# Patient Record
Sex: Female | Born: 1965 | Race: Black or African American | Hispanic: No | Marital: Single | Smoking: Former smoker
Health system: Southern US, Community
[De-identification: ages and names within clinical notes are randomized; demographics above are authoritative.]

## PROBLEM LIST (undated history)

## (undated) DIAGNOSIS — F419 Anxiety disorder, unspecified: Secondary | ICD-10-CM

## (undated) DIAGNOSIS — N63 Unspecified lump in unspecified breast: Secondary | ICD-10-CM

## (undated) DIAGNOSIS — I10 Essential (primary) hypertension: Secondary | ICD-10-CM

## (undated) DIAGNOSIS — F32A Depression, unspecified: Secondary | ICD-10-CM

## (undated) DIAGNOSIS — F329 Major depressive disorder, single episode, unspecified: Secondary | ICD-10-CM

---

## 2005-01-21 ENCOUNTER — Ambulatory Visit: Payer: Self-pay | Admitting: Family Medicine

## 2005-08-11 ENCOUNTER — Encounter: Admission: RE | Admit: 2005-08-11 | Discharge: 2005-08-11 | Payer: Self-pay | Admitting: Obstetrics and Gynecology

## 2005-12-16 ENCOUNTER — Other Ambulatory Visit: Admission: RE | Admit: 2005-12-16 | Discharge: 2005-12-16 | Payer: Self-pay | Admitting: Obstetrics and Gynecology

## 2007-02-19 ENCOUNTER — Encounter: Admission: RE | Admit: 2007-02-19 | Discharge: 2007-02-19 | Payer: Self-pay | Admitting: Obstetrics and Gynecology

## 2007-12-27 ENCOUNTER — Ambulatory Visit (HOSPITAL_COMMUNITY): Admission: RE | Admit: 2007-12-27 | Discharge: 2007-12-27 | Payer: Self-pay | Admitting: Obstetrics and Gynecology

## 2007-12-27 ENCOUNTER — Encounter (INDEPENDENT_AMBULATORY_CARE_PROVIDER_SITE_OTHER): Payer: Self-pay | Admitting: Obstetrics and Gynecology

## 2007-12-28 ENCOUNTER — Inpatient Hospital Stay (HOSPITAL_COMMUNITY): Admission: AD | Admit: 2007-12-28 | Discharge: 2007-12-28 | Payer: Self-pay | Admitting: Obstetrics and Gynecology

## 2007-12-31 ENCOUNTER — Inpatient Hospital Stay (HOSPITAL_COMMUNITY): Admission: AD | Admit: 2007-12-31 | Discharge: 2007-12-31 | Payer: Self-pay | Admitting: Obstetrics and Gynecology

## 2008-01-03 ENCOUNTER — Inpatient Hospital Stay (HOSPITAL_COMMUNITY): Admission: AD | Admit: 2008-01-03 | Discharge: 2008-01-03 | Payer: Self-pay | Admitting: Family Medicine

## 2008-01-05 ENCOUNTER — Inpatient Hospital Stay (HOSPITAL_COMMUNITY): Admission: AD | Admit: 2008-01-05 | Discharge: 2008-01-05 | Payer: Self-pay | Admitting: Obstetrics and Gynecology

## 2008-01-10 ENCOUNTER — Inpatient Hospital Stay (HOSPITAL_COMMUNITY): Admission: AD | Admit: 2008-01-10 | Discharge: 2008-01-10 | Payer: Self-pay | Admitting: Obstetrics and Gynecology

## 2008-01-17 ENCOUNTER — Inpatient Hospital Stay (HOSPITAL_COMMUNITY): Admission: AD | Admit: 2008-01-17 | Discharge: 2008-01-17 | Payer: Self-pay | Admitting: Obstetrics and Gynecology

## 2010-03-03 ENCOUNTER — Encounter: Payer: Self-pay | Admitting: Obstetrics and Gynecology

## 2010-03-04 ENCOUNTER — Encounter: Payer: Self-pay | Admitting: Internal Medicine

## 2010-06-25 NOTE — Op Note (Signed)
Samantha Spence, Samantha Spence             ACCOUNT NO.:  [id number]   MEDICAL RECORD NO.:  [id number]          PATIENT TYPE:  AMB   LOCATION:  [facility]                          FACILITY:  [facility]   PHYSICIAN:  Hal Morales, M.D.DATE OF BIRTH:  Nov 14, 1965   DATE OF PROCEDURE:  12/27/2007  DATE OF DISCHARGE:                               OPERATIVE REPORT   PREOPERATIVE DIAGNOSIS:  Abnormal uterine bleeding with pregnancy.   POSTOPERATIVE DIAGNOSIS:  Abnormal uterine bleeding with pregnancy.   OPERATION:  Dilatation and evacuation.   SURGEON:  Hal Morales, MD.   ANESTHESIA:  General LMA.   ESTIMATED BLOOD LOSS:  Less than 10 mL.   COMPLICATIONS:  None.   FINDINGS:  The uterine cavity deviated slightly to the right.  Minimal  uterine contents were obtained at dilatation and evacuation.   PROCEDURE:  The patient was taken to the operating room after  appropriate identification and after discussion of the indications for  procedures as well as the risks involved, which include but are not  limited to anesthesia, bleeding, infection, or damage to adjacent  organs.  The discussion was also held that this was a means by which  further delineation of the location of her pregnancy could be discerned.  It was explained that if no products of conception are obtained, a  followup HCG will be done and if it has not dropped significantly, then  the patient will be recommended to undergo a methotrexate therapy.  The  patient was thus taken to the operating room and placed on the operating  table.  After placement of equipment for anesthesia and induction of  anesthesia, the patient was placed in the lithotomy position.  The  perineum and vagina were prepped with multiple layers of Betadine and  draped as a sterile field.  A latex-free catheter was then used to empty  the bladder.  A Graves speculum was placed in the vagina and a  paracervical block was achieved with a total of 10 mL of 2%  Xylocaine in  the 5 and 7 o'clock positions.  The cervix was grasped with a tenaculum.  The cervix was then dilated to accommodate a #7 suction curette and this  was used to suction and evacuate all quadrants of the uterus, but  minimal contents were obtained.  Sharp curettage was then undertaken  with minimal contents.  All instruments were then removed from the  vagina after a hemostatic suture was placed on the anterior cervix at  the site of the tenaculum with a suture of 2-0 chromic.  The patient was  awakened from anesthesia and taken to the recovery room in satisfactory  condition, having tolerated the procedure well with sponge and  instrument counts correct.   SPECIMENS TO PATHOLOGY:  Uterine contents.   DISCHARGE INSTRUCTIONS:  Printed instructions from the [facility]  for D&E.   DISCHARGE MEDICATIONS:  1. Vicodin 1-2 p.o. q.4 h. p.r.n. pain.  2. Ibuprofen 600 mg p.o. q.6 h. for the next 3 days, then q.6 h.      p.r.n. pain  3. Doxycycline 100 mg p.o. b.i.d.  for the next 7 days.   FOLLOWUP:  The patient will follow up in 1 week for a followup HCG and  then we will have a 2-week postoperative appointment.      Hal Morales, M.D.  Electronically Signed     VPH/MEDQ  D:  12/27/2007  T:  12/28/2007  Job:  [id number]

## 2010-06-25 NOTE — Consult Note (Signed)
Samantha Spence, Samantha Spence             ACCOUNT NO.:  [id number]   MEDICAL RECORD NO.:  [id number]          PATIENT TYPE:  MAT   LOCATION:  [facility]                          FACILITY:  [facility]   PHYSICIAN:  Janine Limbo, M.D.DATE OF BIRTH:  [birth year]   DATE OF CONSULTATION:  DATE OF DISCHARGE:  12/28/2007                                 CONSULTATION   HISTORY OF PRESENT ILLNESS:  Samantha Spence is a 45 year old female, gravida  4, para 2-0-1-2, who presents with a very early pregnancy.  Her HCG  values had been increasing, but not appropriately.  The patient had  dilatation and evacuation on December 27, 2007, which showed scant  tissue.  Pathology report returned showing no villi.  The patient  complains of mild left lower quadrant discomfort.  She has had vaginal  spotting, but it is not as heavy as it has been in the past.   OBSTETRICAL HISTORY:  The patient has had two term births and one first  trimester loss.   DRUG ALLERGIES:  DARVON, LATEX, DEMEROL, and SULFA MEDICATIONS.   PAST MEDICAL HISTORY:  The patient has a history of hypertension,  anxiety, depression, kidney stones, and migraine headaches.   CURRENT MEDICATIONS:  Norvasc, Benicar, doxycycline,  hydrochlorothiazide, potassium chloride, Xanax, Vicodin, and Zoloft.   SOCIAL HISTORY:  The patient smokes cigarettes.   REVIEW OF SYSTEMS:  See history of present illness.   SURGICAL HISTORY:  The patient has had 2 C-sections, a tonsillectomy,  and a cyst removed from her left wrist.   FAMILY HISTORY:  Noncontributory.   PHYSICAL EXAMINATION:  VITAL SIGNS:  Blood pressure is 126/87, pulse 68,  temperature 98.6, respirations 20.  HEENT:  Within normal limits.  CHEST:  Clear.  HEART:  Regular rate and rhythm.  ABDOMEN:  Soft and nontender.  BACK:  No CVA tenderness.  PELVIC:  Deferred because of concern for a rupturing ectopic pregnancy.   LABORATORY VALUES:  Blood type is O+.  Quantitative HCG on February 27, 2007, is  128.  SGOT is 119, HCG on February 26, 2007, is 108.  Chemistries on February 26, 2007, were within normal limits.  Hemoglobin  is 12.1, white blood cell count is 10,400, platelet count is 355,000.   ASSESSMENT:  1. Probable ectopic pregnancy.  2. Hypertension.  3. Anxiety.  4. Depression.   PLAN:  The patient will receive methotrexate today per protocol.  We  will follow HCGs until the value returns to 0.  The patient was told to  call for questions or concerns.  She is to call for increased pain or  increase in her bleeding.  The patient was told that she can stay out of  work until Monday, January 03, 2008, but she elects to return to work  and says that she will call if she simply finds it too difficult.  We  will then furnish a note for her.      Janine Limbo, M.D.  Electronically Signed     AVS/MEDQ  D:  12/28/2007  T:  12/29/2007  Job:  [id number]

## 2010-07-04 ENCOUNTER — Other Ambulatory Visit: Payer: Self-pay | Admitting: Obstetrics and Gynecology

## 2010-07-16 ENCOUNTER — Other Ambulatory Visit: Payer: Self-pay

## 2010-07-19 ENCOUNTER — Other Ambulatory Visit: Payer: Self-pay

## 2010-11-13 LAB — CBC
MCHC: 33.4
MCHC: 33.5
MCV: 89.8
MCV: 90
Platelets: 362
RBC: 3.91
RBC: 4.03
RDW: 14.8
RDW: 14.8

## 2010-11-13 LAB — AST: AST: 19

## 2010-11-13 LAB — BASIC METABOLIC PANEL
CO2: 22
Calcium: 8.8
Creatinine, Ser: 0.66
GFR calc Af Amer: >60
Sodium: 131 — ABNORMAL LOW

## 2010-11-13 LAB — HCG, QUANTITATIVE, PREGNANCY
hCG, Beta Chain, Quant, S: 108 — ABNORMAL HIGH
hCG, Beta Chain, Quant, S: 110 — ABNORMAL HIGH
hCG, Beta Chain, Quant, S: 128 — ABNORMAL HIGH
hCG, Beta Chain, Quant, S: 44 — ABNORMAL HIGH
hCG, Beta Chain, Quant, S: 6 — ABNORMAL HIGH

## 2010-11-13 LAB — ABO/RH: ABO/RH(D): O POS

## 2011-07-08 ENCOUNTER — Ambulatory Visit: Payer: Self-pay | Admitting: Obstetrics and Gynecology

## 2016-04-09 ENCOUNTER — Other Ambulatory Visit: Payer: Self-pay | Admitting: Obstetrics and Gynecology

## 2016-04-09 DIAGNOSIS — N6310 Unspecified lump in the right breast, unspecified quadrant: Secondary | ICD-10-CM

## 2016-04-18 ENCOUNTER — Other Ambulatory Visit: Payer: Self-pay | Admitting: Obstetrics and Gynecology

## 2016-04-18 ENCOUNTER — Ambulatory Visit
Admission: RE | Admit: 2016-04-18 | Discharge: 2016-04-18 | Disposition: A | Discharge status: Home / Self Care | Payer: Self-pay | Source: Ambulatory Visit | Attending: Obstetrics and Gynecology | Admitting: Obstetrics and Gynecology

## 2016-04-18 DIAGNOSIS — N6310 Unspecified lump in the right breast, unspecified quadrant: Secondary | ICD-10-CM

## 2016-04-18 DIAGNOSIS — N6320 Unspecified lump in the left breast, unspecified quadrant: Secondary | ICD-10-CM

## 2016-04-18 DIAGNOSIS — N63 Unspecified lump in unspecified breast: Secondary | ICD-10-CM

## 2016-04-18 HISTORY — DX: Unspecified lump in unspecified breast: N63.0

## 2016-04-23 ENCOUNTER — Ambulatory Visit
Admission: RE | Admit: 2016-04-23 | Discharge: 2016-04-23 | Disposition: A | Discharge status: Home / Self Care | Payer: Commercial Managed Care - PPO | Source: Ambulatory Visit | Attending: Obstetrics and Gynecology | Admitting: Obstetrics and Gynecology

## 2016-04-23 DIAGNOSIS — N6320 Unspecified lump in the left breast, unspecified quadrant: Secondary | ICD-10-CM

## 2016-05-14 ENCOUNTER — Emergency Department (HOSPITAL_BASED_OUTPATIENT_CLINIC_OR_DEPARTMENT_OTHER): Payer: Commercial Managed Care - PPO

## 2016-05-14 ENCOUNTER — Emergency Department (HOSPITAL_BASED_OUTPATIENT_CLINIC_OR_DEPARTMENT_OTHER)
Admission: EM | Admit: 2016-05-14 | Discharge: 2016-05-14 | Disposition: A | Discharge status: Home / Self Care | Payer: Commercial Managed Care - PPO | Attending: Emergency Medicine | Admitting: Emergency Medicine

## 2016-05-14 ENCOUNTER — Encounter (HOSPITAL_BASED_OUTPATIENT_CLINIC_OR_DEPARTMENT_OTHER): Payer: Self-pay | Admitting: Emergency Medicine

## 2016-05-14 DIAGNOSIS — Y929 Unspecified place or not applicable: Secondary | ICD-10-CM | POA: Diagnosis not present

## 2016-05-14 DIAGNOSIS — Y999 Unspecified external cause status: Secondary | ICD-10-CM | POA: Diagnosis not present

## 2016-05-14 DIAGNOSIS — W010XXA Fall on same level from slipping, tripping and stumbling without subsequent striking against object, initial encounter: Secondary | ICD-10-CM | POA: Diagnosis not present

## 2016-05-14 DIAGNOSIS — M79675 Pain in left toe(s): Secondary | ICD-10-CM | POA: Diagnosis not present

## 2016-05-14 DIAGNOSIS — S99922A Unspecified injury of left foot, initial encounter: Secondary | ICD-10-CM

## 2016-05-14 DIAGNOSIS — Z87891 Personal history of nicotine dependence: Secondary | ICD-10-CM | POA: Insufficient documentation

## 2016-05-14 DIAGNOSIS — I10 Essential (primary) hypertension: Secondary | ICD-10-CM | POA: Insufficient documentation

## 2016-05-14 DIAGNOSIS — Y939 Activity, unspecified: Secondary | ICD-10-CM | POA: Diagnosis not present

## 2016-05-14 HISTORY — DX: Anxiety disorder, unspecified: F41.9

## 2016-05-14 HISTORY — DX: Essential (primary) hypertension: I10

## 2016-05-14 HISTORY — DX: Depression, unspecified: F32.A

## 2016-05-14 HISTORY — DX: Major depressive disorder, single episode, unspecified: F32.9

## 2016-05-14 MED ORDER — IBUPROFEN 800 MG PO TABS: 800.0000 mg | ORAL_TABLET | Freq: Three times a day (TID) | ORAL | 0 refills | Status: AC | PRN

## 2016-05-14 NOTE — Discharge Instructions (Signed)
Return here as needed. Follow up with a primary doctor. °

## 2016-05-14 NOTE — ED Provider Notes (Signed)
[facility] Provider Note   CSN: [id number] Arrival date & time: 05/14/16  2055  By signing my name below, I, Samantha Spence, attest that this documentation has been prepared under the direction and in the presence of [employer], PA-C.  Electronically Signed: Julien Spence, ED Scribe. 05/14/16. 10:37 PM.    History   Chief Complaint Chief Complaint  Patient presents with  . Toe Injury   The history is provided by the patient. No language interpreter was used.   HPI Comments: Samantha Spence is a 51 y.o. female who presents to the Emergency Department complaining of acute onset, moderate left 4th digit toe pain s/p an injury that occurred ~ 3 pm this afternoon. She reports associated swelling and ecchymosis to the area. Pt notes that she stumbled and injured her left foot earlier. She expresses increased pain with ambulation. Pt has not taken any medication to alleviate her pain. Pt does not have any other complaints.   Past Medical History:  Diagnosis Date  . Anxiety   . Breast mass 04/18/2016   right breast lump  . Depression   . Hypertension     There are no active problems to display for this patient.   Past Surgical History:  Procedure Laterality Date  . CESAREAN SECTION      OB History    No data available       Home Medications    Prior to Admission medications   Medication Sig Start Date End Date Taking? Authorizing Provider  ALPRAZolam (XANAX) 0.25 MG tablet Take 0.25 mg by mouth daily.   Yes Historical Provider, MD  amLODipine (NORVASC) 10 MG tablet Take 10 mg by mouth daily.   Yes Historical Provider, MD  hydrochlorothiazide (HYDRODIURIL) 25 MG tablet Take 25 mg by mouth daily.   Yes Historical Provider, MD  sertraline (ZOLOFT) 50 MG tablet Take 50 mg by mouth daily.   Yes Historical Provider, MD    Family History No family history on file.  Social History Social History  Substance Use Topics  . Smoking status: Former Research scientist (life sciences)    . Smokeless tobacco: Former Systems developer  . Alcohol use No     Allergies   Darvon [propoxyphene]; Demerol [meperidine]; and Sulfa antibiotics   Review of Systems Review of Systems  A complete 10 system review of systems was obtained and all systems are negative except as noted in the HPI and PMH.   Physical Exam Updated Vital Signs BP (!) 158/100 (BP Location: Left Arm)   Pulse 97   Temp 98.8 F (37.1 C) (Oral)   Resp 16   Ht 5' 7.5" (1.715 m)   Wt 185 lb (83.9 kg)   LMP 04/13/2016 (Approximate)   SpO2 98%   BMI 28.55 kg/m   Physical Exam  Constitutional: She is oriented to person, place, and time. She appears well-developed and well-nourished.  HENT:  Head: Normocephalic.  Eyes: EOM are normal.  Neck: Normal range of motion.  Pulmonary/Chest: Effort normal.  Abdominal: She exhibits no distension.  Musculoskeletal: Normal range of motion. She exhibits tenderness.  Pain at the base of the left fourth toe  Neurological: She is alert and oriented to person, place, and time.  Psychiatric: She has a normal mood and affect.  Nursing note and vitals reviewed.    ED Treatments / Results  DIAGNOSTIC STUDIES: Oxygen Saturation is 98% on RA, normal by my interpretation.  COORDINATION OF CARE:  10:36 PM Discussed treatment plan with pt at bedside and pt  agreed to plan.  Labs (all labs ordered are listed, but only abnormal results are displayed) Labs Reviewed - No data to display  EKG  EKG Interpretation None       Radiology Dg Foot Complete Left  Result Date: 05/14/2016 CLINICAL DATA:  Trip and fall injury this afternoon. Persistent pain at the left fourth toe. EXAM: LEFT FOOT - COMPLETE 3+ VIEW COMPARISON:  None. FINDINGS: There is no evidence of fracture or dislocation. There is no evidence of arthropathy or other focal bone abnormality. Soft tissues are unremarkable. IMPRESSION: Negative for fracture, dislocation or radiopaque foreign body. Electronically Signed   By:  Andreas Newport M.D.   On: 05/14/2016 21:29    Procedures Procedures (including critical care time)  Medications Ordered in ED Medications - No data to display   Initial Impression / Assessment and Plan / ED Course  I have reviewed the triage vital signs and the nursing notes.  Pertinent labs & imaging results that were available during my care of the patient were reviewed by me and considered in my medical decision making (see chart for details).     Patient will be treated for sprain and contusion of the toe.  Told to return here as needed.  Told to follow-up with her primary care doctor  Final Clinical Impressions(s) / ED Diagnoses   Final diagnoses:  None  I personally performed the services described in this documentation, which was scribed in my presence. The recorded information has been reviewed and is accurate. New Prescriptions   No medications on file     Dalia Heading, PA-C 05/16/16 0225    Julianne Rice, MD 05/17/16 [phone]

## 2016-05-14 NOTE — ED Triage Notes (Signed)
Pt sts she "stumbled" this afternoon and then felt pain in left foot around 4th toe.  Discoloration proximal to 4th toe. Slight swelling.

## 2016-07-01 ENCOUNTER — Other Ambulatory Visit: Payer: Self-pay | Admitting: Internal Medicine

## 2016-07-01 DIAGNOSIS — N63 Unspecified lump in unspecified breast: Secondary | ICD-10-CM

## 2016-07-04 ENCOUNTER — Other Ambulatory Visit: Payer: Self-pay | Admitting: Internal Medicine

## 2016-07-04 DIAGNOSIS — R928 Other abnormal and inconclusive findings on diagnostic imaging of breast: Secondary | ICD-10-CM

## 2017-10-19 ENCOUNTER — Other Ambulatory Visit: Payer: Self-pay | Admitting: Obstetrics and Gynecology

## 2017-10-19 DIAGNOSIS — D241 Benign neoplasm of right breast: Secondary | ICD-10-CM

## 2017-10-26 ENCOUNTER — Other Ambulatory Visit: Payer: Commercial Managed Care - PPO

## 2018-05-23 IMAGING — MG 2D DIGITAL DIAGNOSTIC BILATERAL MAMMOGRAM WITH CAD AND ADJUNCT T
8 of 15 series · 8 of 35 positions shown · non-contrast
Comparison: Previous exam(s).

CLINICAL DATA: Patient presents for evaluation of palpable
abnormality within the right breast. Patient states the palpable
abnormality has been present for almost 25-30 years.

EXAM:
DIGITAL DIAGNOSTIC BILATERAL MAMMOGRAM WITH CAD
ULTRASOUND RIGHT BREAST

[L CC]
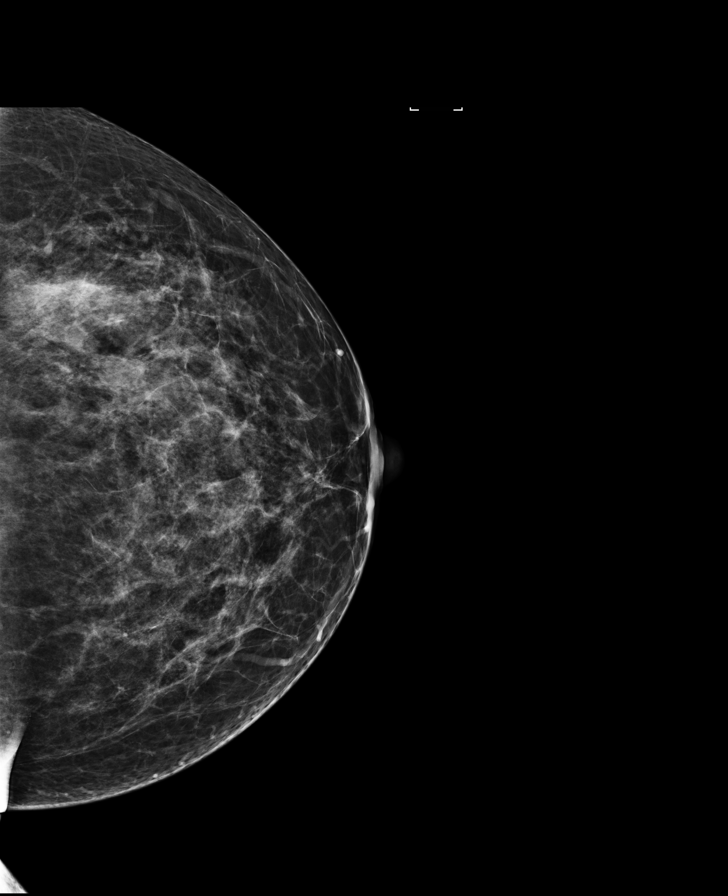

[L MLO synth-2D]
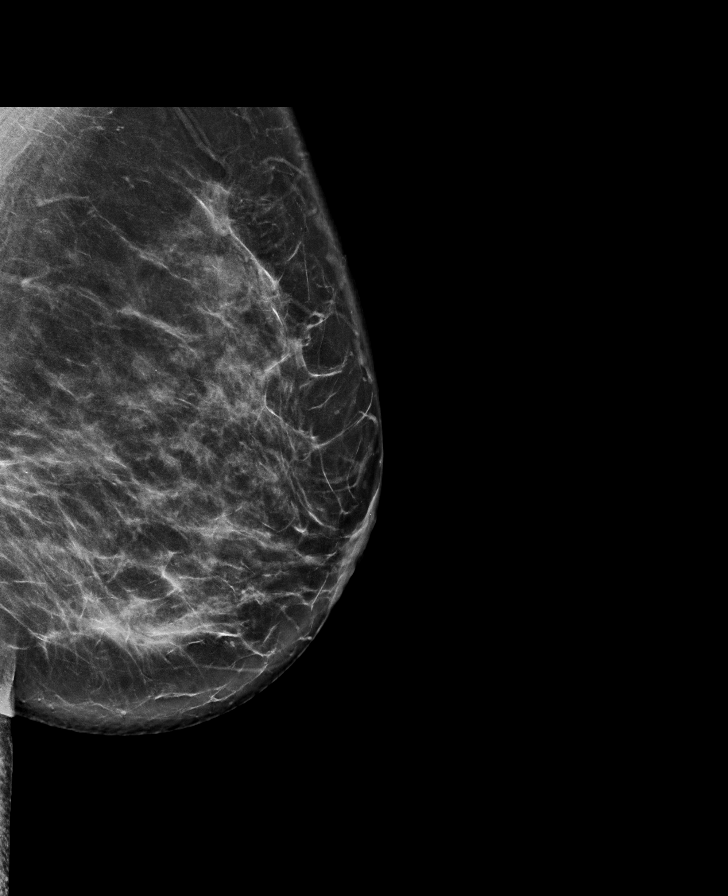

[R TAN]
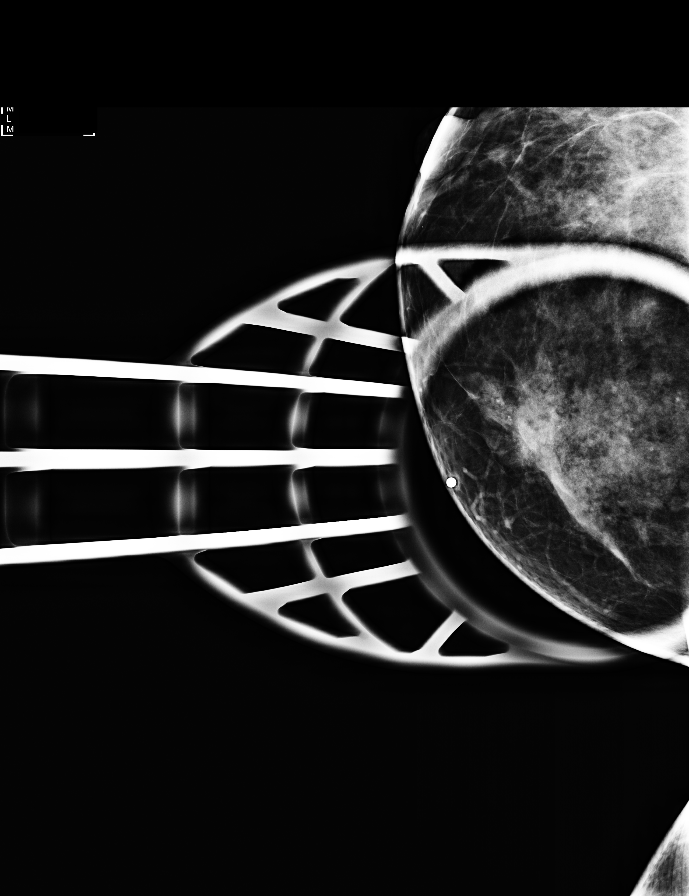

[R CC]
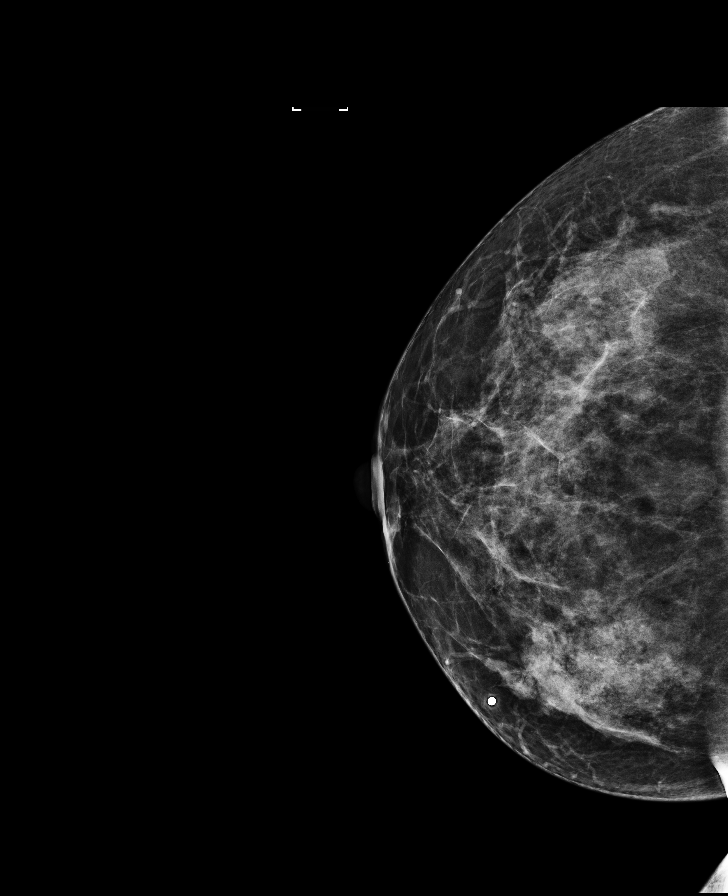

[L MLO]
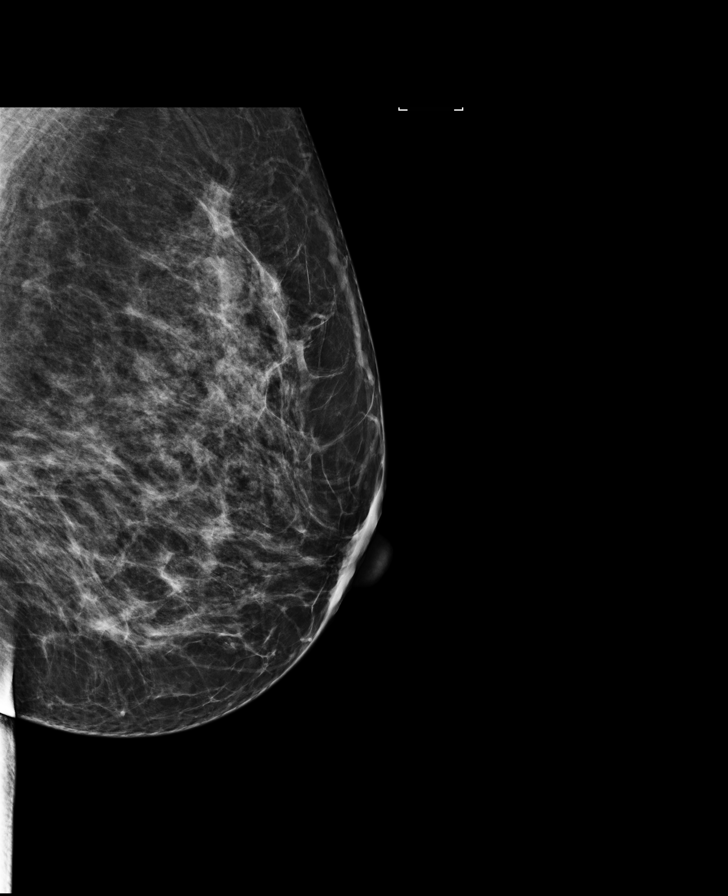

[R MLO synth-2D]
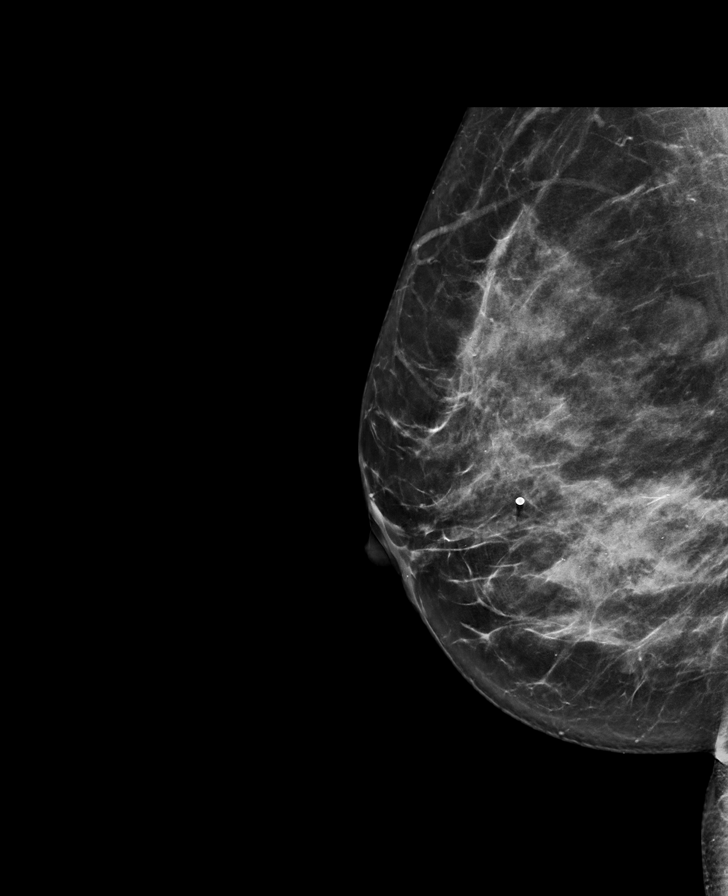

[R TAN synth-2D]
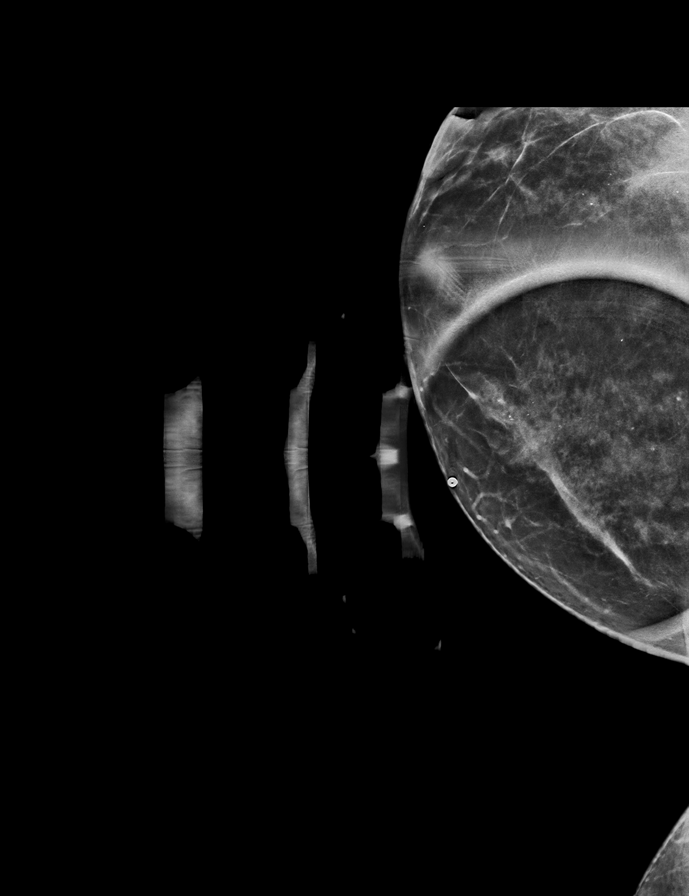

[L CC synth-2D]
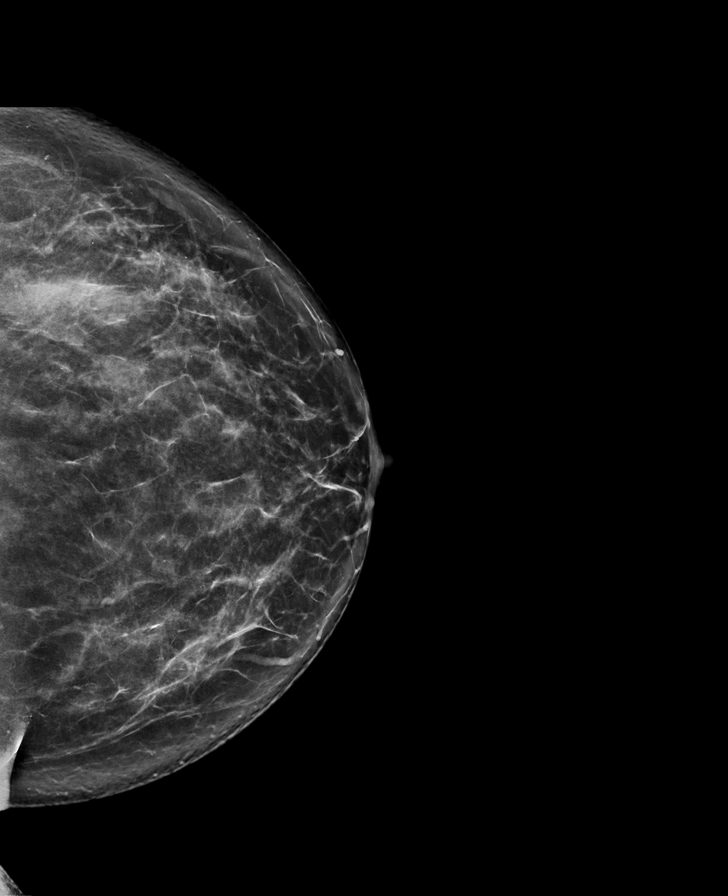

[8 of 35 positions shown; findings below may reference images not displayed]

ACR Breast Density Category c: The breast tissue is heterogeneously
dense, which may obscure small masses.
FINDINGS: Grossly unchanged fibroglandular tissue within the right breast.
Additionally unchanged oval circumscribed mass within the posterior
right breast, given stability over time, compatible with a benign
fibroadenoma. Within the upper-outer left breast demonstrated best
on tomosynthesis images there is a focal asymmetry/small mass.

Mammographic images were processed with CAD.

On physical exam, I palpate a area of thickening within the medial
anterior right breast.

Targeted ultrasound is performed, showing normal dense tissue within
the medial anterior right breast at the site of palpable
abnormality.
IMPRESSION: Probable abnormality corresponds with dense fibroglandular tissue.
No mammographic evidence for malignancy within the right breast.

Probable small mass within the upper-outer left breast. This needs
further evaluation with ultrasound. Ultrasound was not able to be
performed on 04/18/2016 as we did not have an order.

RECOMMENDATION:
Patient needs to return for sonographic evaluation of possible mass
within the upper-outer left breast.

No mammographic evidence for malignancy in the right breast.

I have discussed the findings and recommendations with the patient.
Results were also provided in writing at the conclusion of the
visit. If applicable, a reminder letter will be sent to the patient
regarding the next appointment.

BI-RADS CATEGORY  0: Incomplete. Need additional imaging evaluation
and/or prior mammograms for comparison.

## 2020-07-03 ENCOUNTER — Other Ambulatory Visit: Payer: Self-pay | Admitting: Obstetrics and Gynecology

## 2020-07-03 DIAGNOSIS — N63 Unspecified lump in unspecified breast: Secondary | ICD-10-CM

## 2020-07-30 ENCOUNTER — Other Ambulatory Visit: Payer: Commercial Managed Care - PPO

## 2020-08-21 ENCOUNTER — Other Ambulatory Visit: Payer: Commercial Managed Care - PPO
